# Patient Record
Sex: Male | Born: 1990 | Marital: Single | Smoking: Current every day smoker
Health system: Southern US, Community
[De-identification: ages and names within clinical notes are randomized; demographics above are authoritative.]

---

## 2015-12-07 ENCOUNTER — Ambulatory Visit (INDEPENDENT_AMBULATORY_CARE_PROVIDER_SITE_OTHER): Payer: Self-pay | Admitting: Family Medicine

## 2015-12-07 DIAGNOSIS — Z5329 Procedure and treatment not carried out because of patient's decision for other reasons: Secondary | ICD-10-CM

## 2015-12-07 NOTE — Progress Notes (Signed)
No show. Reschedule PRN

## 2015-12-14 ENCOUNTER — Telehealth: Payer: Self-pay

## 2015-12-14 ENCOUNTER — Encounter: Payer: Self-pay | Admitting: Family Medicine

## 2015-12-14 ENCOUNTER — Ambulatory Visit (INDEPENDENT_AMBULATORY_CARE_PROVIDER_SITE_OTHER): Payer: PRIVATE HEALTH INSURANCE | Admitting: Family Medicine

## 2015-12-14 VITALS — BP 107/74 | HR 80 | Ht 69.29 in | Wt 124.0 lb

## 2015-12-14 DIAGNOSIS — F321 Major depressive disorder, single episode, moderate: Secondary | ICD-10-CM | POA: Diagnosis not present

## 2015-12-14 DIAGNOSIS — Z0000 Encounter for general adult medical examination without abnormal findings: Secondary | ICD-10-CM

## 2015-12-14 DIAGNOSIS — IMO0001 Reserved for inherently not codable concepts without codable children: Secondary | ICD-10-CM

## 2015-12-14 DIAGNOSIS — F329 Major depressive disorder, single episode, unspecified: Secondary | ICD-10-CM | POA: Insufficient documentation

## 2015-12-14 MED ORDER — CITALOPRAM HYDROBROMIDE 20 MG PO TABS: 20.0000 mg | ORAL_TABLET | Freq: Every day | ORAL | Status: AC

## 2015-12-14 NOTE — Telephone Encounter (Signed)
Opened in error
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
## 2015-12-14 NOTE — Patient Instructions (Signed)
Thank you for coming in today. Start Celexa Return in 3 weeks.   Major Depressive Disorder Major depressive disorder is a mental illness. It also may be called clinical depression or unipolar depression. Major depressive disorder usually causes feelings of sadness, hopelessness, or helplessness. Some people with this disorder do not feel particularly sad but lose interest in doing things they used to enjoy (anhedonia). Major depressive disorder also can cause physical symptoms. It can interfere with work, school, relationships, and other normal everyday activities. The disorder varies in severity but is longer lasting and more serious than the sadness we all feel from time to time in our lives. Major depressive disorder often is triggered by stressful life events or major life changes. Examples of these triggers include divorce, loss of your job or home, a move, and the death of a family member or close friend. Sometimes this disorder occurs for no obvious reason at all. People who have family members with major depressive disorder or bipolar disorder are at higher risk for developing this disorder, with or without life stressors. Major depressive disorder can occur at any age. It may occur just once in your life (single episode major depressive disorder). It may occur multiple times (recurrent major depressive disorder). SYMPTOMS People with major depressive disorder have either anhedonia or depressed mood on nearly a daily basis for at least 2 weeks or longer. Symptoms of depressed mood include:  Feelings of sadness (blue or down in the dumps) or emptiness.  Feelings of hopelessness or helplessness.  Tearfulness or episodes of crying (may be observed by others).  Irritability (children and adolescents). In addition to depressed mood or anhedonia or both, people with this disorder have at least four of the following symptoms:  Difficulty sleeping or sleeping too much.   Significant change  (increase or decrease) in appetite or weight.   Lack of energy or motivation.  Feelings of guilt and worthlessness.   Difficulty concentrating, remembering, or making decisions.  Unusually slow movement (psychomotor retardation) or restlessness (as observed by others).   Recurrent wishes for death, recurrent thoughts of self-harm (suicide), or a suicide attempt. People with major depressive disorder commonly have persistent negative thoughts about themselves, other people, and the world. People with severe major depressive disorder may experiencedistorted beliefs or perceptions about the world (psychotic delusions). They also may see or hear things that are not real (psychotic hallucinations). DIAGNOSIS Major depressive disorder is diagnosed through an assessment by your health care provider. Your health care provider will ask aboutaspects of your daily life, such as mood,sleep, and appetite, to see if you have the diagnostic symptoms of major depressive disorder. Your health care provider may ask about your medical history and use of alcohol or drugs, including prescription medicines. Your health care provider also may do a physical exam and blood work. This is because certain medical conditions and the use of certain substances can cause major depressive disorder-like symptoms (secondary depression). Your health care provider also may refer you to a mental health specialist for further evaluation and treatment. TREATMENT It is important to recognize the symptoms of major depressive disorder and seek treatment. The following treatments can be prescribed for this disorder:   Medicine. Antidepressant medicines usually are prescribed. Antidepressant medicines are thought to correct chemical imbalances in the brain that are commonly associated with major depressive disorder. Other types of medicine may be added if the symptoms do not respond to antidepressant medicines alone or if psychotic  delusions or hallucinations occur.  Talk therapy. Talk therapy can be helpful in treating major depressive disorder by providing support, education, and guidance. Certain types of talk therapy also can help with negative thinking (cognitive behavioral therapy) and with relationship issues that trigger this disorder (interpersonal therapy). A mental health specialist can help determine which treatment is best for you. Most people with major depressive disorder do well with a combination of medicine and talk therapy. Treatments involving electrical stimulation of the brain can be used in situations with extremely severe symptoms or when medicine and talk therapy do not work over time. These treatments include electroconvulsive therapy, transcranial magnetic stimulation, and vagal nerve stimulation.   This information is not intended to replace advice given to you by your health care provider. Make sure you discuss any questions you have with your health care provider.   Document Released: 09/28/2012 Document Revised: 06/24/2014 Document Reviewed: 09/28/2012 Elsevier Interactive Patient Education [employer].

## 2016-01-04 ENCOUNTER — Ambulatory Visit (INDEPENDENT_AMBULATORY_CARE_PROVIDER_SITE_OTHER): Payer: PRIVATE HEALTH INSURANCE | Admitting: Family Medicine

## 2016-01-04 DIAGNOSIS — Z5329 Procedure and treatment not carried out because of patient's decision for other reasons: Secondary | ICD-10-CM

## 2016-01-04 NOTE — Progress Notes (Signed)
No show. Return soon.

## 2016-07-25 ENCOUNTER — Emergency Department (HOSPITAL_COMMUNITY)
Admission: EM | Admit: 2016-07-25 | Discharge: 2016-07-26 | Disposition: A | Discharge status: Home / Self Care | Payer: No Typology Code available for payment source | Attending: Emergency Medicine | Admitting: Emergency Medicine

## 2016-07-25 ENCOUNTER — Encounter (HOSPITAL_COMMUNITY): Payer: Self-pay | Admitting: Emergency Medicine

## 2016-07-25 DIAGNOSIS — F17200 Nicotine dependence, unspecified, uncomplicated: Secondary | ICD-10-CM | POA: Insufficient documentation

## 2016-07-25 DIAGNOSIS — Y9289 Other specified places as the place of occurrence of the external cause: Secondary | ICD-10-CM | POA: Diagnosis not present

## 2016-07-25 DIAGNOSIS — Y939 Activity, unspecified: Secondary | ICD-10-CM | POA: Insufficient documentation

## 2016-07-25 DIAGNOSIS — S7001XA Contusion of right hip, initial encounter: Secondary | ICD-10-CM | POA: Insufficient documentation

## 2016-07-25 DIAGNOSIS — Y999 Unspecified external cause status: Secondary | ICD-10-CM | POA: Insufficient documentation

## 2016-07-25 DIAGNOSIS — S0083XA Contusion of other part of head, initial encounter: Secondary | ICD-10-CM | POA: Diagnosis not present

## 2016-07-25 DIAGNOSIS — R930 Abnormal findings on diagnostic imaging of skull and head, not elsewhere classified: Secondary | ICD-10-CM | POA: Insufficient documentation

## 2016-07-25 DIAGNOSIS — Z79899 Other long term (current) drug therapy: Secondary | ICD-10-CM | POA: Insufficient documentation

## 2016-07-25 DIAGNOSIS — S0993XA Unspecified injury of face, initial encounter: Secondary | ICD-10-CM | POA: Diagnosis present
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
## 2016-07-26 ENCOUNTER — Emergency Department (HOSPITAL_COMMUNITY): Payer: No Typology Code available for payment source

## 2016-07-26 MED ORDER — HYDROCODONE-ACETAMINOPHEN 5-325 MG PO TABS: 1.0000 | ORAL_TABLET | Freq: Four times a day (QID) | ORAL | 0 refills | Status: AC | PRN

## 2016-07-26 MED ORDER — HYDROCODONE-ACETAMINOPHEN 5-325 MG PO TABS
2.0000 | ORAL_TABLET | Freq: Once | ORAL | Status: AC
  Administered 2016-07-26: 2 via ORAL
  Filled 2016-07-26: qty 2
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
## 2016-07-26 NOTE — Discharge Instructions (Signed)
Medications: Norco  Treatment: Take 1-2 Norco every 4-6 hours as needed for pain. Do not drive or operate machinery when taking Norco and only take as prescribed. You can alternate with ibuprofen as prescribed over-the-counter. Use ice to your jaw and hip 3-4 times daily alternating 20 minutes on, 20 minutes off. Ambulate with crutches carefully as needed. Begin bearing weight as tolerated.  Follow-up: Please follow-up with orthopedic doctor, Dr. Ranell PatrickNorris, as needed if your symptoms are not improving over the next few days or if you continue to be unable to bear weight on your leg.  An incidental finding was found on your head CT. You do not need follow up however, if you ever begin experiencing headaches, you may want to see a neurologist. 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

## 2016-09-27 ENCOUNTER — Ambulatory Visit (HOSPITAL_COMMUNITY)
Admission: EM | Admit: 2016-09-27 | Discharge: 2016-09-27 | Disposition: A | Discharge status: Home / Self Care | Payer: PRIVATE HEALTH INSURANCE | Attending: Family Medicine | Admitting: Family Medicine

## 2016-09-27 ENCOUNTER — Encounter (HOSPITAL_COMMUNITY): Payer: Self-pay | Admitting: Family Medicine

## 2016-09-27 DIAGNOSIS — W19XXXA Unspecified fall, initial encounter: Secondary | ICD-10-CM

## 2016-09-27 DIAGNOSIS — S0181XA Laceration without foreign body of other part of head, initial encounter: Secondary | ICD-10-CM

## 2016-09-27 NOTE — Discharge Instructions (Signed)
Follow up in 7 - 10 days for suture removal °
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
## 2016-09-27 NOTE — ED Provider Notes (Signed)
CSN: [id number]     Arrival date & time 09/27/16  1831 History   First MD Initiated Contact with Patient 09/27/16 1907     Chief Complaint  Patient presents with  . Laceration   (Consider location/radiation/quality/duration/timing/severity/associated sxs/prior Treatment) [place] and fell on a rock and lacerated his right forehead.  He denies LOC.   The history is provided by the patient.  Laceration  Location:  Head/neck Length:  5 cm Quality: jagged   Bleeding: venous   Time since incident:  3 hours Laceration mechanism:  Blunt object Pain details:    Quality:  Aching   Severity:  Mild   Timing:  Constant   Progression:  Unchanged Foreign body present:  No foreign bodies Relieved by:  Nothing Worsened by:  Nothing Tetanus status:  Up to date   History reviewed. No pertinent past medical history. History reviewed. No pertinent surgical history. History reviewed. No pertinent family history. Social History  Substance Use Topics  . Smoking status: Current Every Day Smoker  . Smokeless tobacco: Never Used  . Alcohol use No    Review of Systems  Constitutional: Negative.   HENT: Negative.   Eyes: Negative.   Respiratory: Negative.   Gastrointestinal: Negative.   Endocrine: Negative.   Genitourinary: Negative.   Musculoskeletal: Negative.   Skin: Positive for wound.  Allergic/Immunologic: Negative.   Neurological: Negative.   Hematological: Negative.   Psychiatric/Behavioral: Negative.     Allergies  Patient has no known allergies.  Home Medications   Prior to Admission medications   Medication Sig Start Date End Date Taking? Authorizing Provider  citalopram (CELEXA) 20 MG tablet Take 1 tablet (20 mg total) by mouth daily. 12/14/15   Rodolph Bong, MD  HYDROcodone-acetaminophen (NORCO/VICODIN) 5-325 MG tablet Take 1-2 tablets by mouth every 6 (six) hours as needed. 07/26/16   Emi Holes, PA-C   Meds Ordered and  Administered this Visit  Medications - No data to display  BP 120/71   Pulse 68   Temp 98.2 F (36.8 C)   Resp 18   SpO2 100%  No data found.   Physical Exam  Constitutional: He appears well-developed and well-nourished.  HENT:  Head: Normocephalic and atraumatic.  Eyes: Conjunctivae and EOM are normal. Pupils are equal, round, and reactive to light.  Neck: Normal range of motion. Neck supple.  Cardiovascular: Normal rate, regular rhythm and normal heart sounds.   Pulmonary/Chest: Effort normal and breath sounds normal.  Skin:  Laceration approx 5 cm right forehead.  Nursing note and vitals reviewed.   Urgent Care Course     .Marland KitchenLaceration Repair Date/Time: 09/27/2016 8:08 PM Performed by: Deatra Canter Authorized by: Hazeline Junker B   Consent:    Consent obtained:  Verbal   Consent given by:  Patient   Risks discussed:  Infection   Alternatives discussed:  No treatment Anesthesia (see MAR for exact dosages):    Anesthesia method:  Local infiltration   Local anesthetic:  Lidocaine 1% WITH epi Laceration details:    Location:  Face   Length (cm):  5   Depth (mm):  3 Repair type:    Repair type:  Simple Pre-procedure details:    Preparation:  Patient was prepped and draped in usual sterile fashion Exploration:    Hemostasis achieved with:  Direct pressure   Wound extent: areolar tissue violated     Contaminated: no   Treatment:    Area cleansed with:  Betadine and saline   Amount of cleaning:  Standard   Visualized foreign bodies/material removed: no   Skin repair:    Repair method:  Sutures   Suture size:  5-0   Suture material:  Nylon   Number of sutures:  7 Approximation:    Approximation:  Close   Vermilion border: well-aligned   Post-procedure details:    Dressing:  Antibiotic ointment   Patient tolerance of procedure:  Tolerated well, no immediate complications   (including critical care time)  Labs Review Labs Reviewed - No data to  display  Imaging Review No results found.   Visual Acuity Review  Right Eye Distance:   Left Eye Distance:   Bilateral Distance:    Right Eye Near:   Left Eye Near:    Bilateral Near:         MDM   1. Laceration of forehead, initial encounter         Deatra Canter, FNP 09/27/16 2011

## 2016-10-05 ENCOUNTER — Ambulatory Visit (HOSPITAL_COMMUNITY)
Admission: EM | Admit: 2016-10-05 | Discharge: 2016-10-05 | Disposition: A | Discharge status: Home / Self Care | Payer: Self-pay

## 2016-10-05 ENCOUNTER — Encounter (HOSPITAL_COMMUNITY): Payer: Self-pay | Admitting: Emergency Medicine

## 2016-10-05 DIAGNOSIS — Z4802 Encounter for removal of sutures: Secondary | ICD-10-CM

## 2016-10-05 NOTE — ED Triage Notes (Signed)
Pt here for suture removal of a laceration to the right forehead.  Wound is clean, dry and intact.

## 2016-10-05 NOTE — ED Notes (Signed)
Removed 7 sutures from pt's right forehead.  Pt denies any pain.  Pt instructed to keep wound clean until it completely heals.  Pt stated understanding.

## 2017-04-04 ENCOUNTER — Emergency Department (HOSPITAL_COMMUNITY)
Admission: EM | Admit: 2017-04-04 | Discharge: 2017-04-04 | Disposition: A | Discharge status: Home / Self Care | Payer: No Typology Code available for payment source | Attending: Emergency Medicine | Admitting: Emergency Medicine

## 2017-04-04 ENCOUNTER — Encounter (HOSPITAL_COMMUNITY): Payer: Self-pay

## 2017-04-04 ENCOUNTER — Emergency Department (HOSPITAL_COMMUNITY): Payer: No Typology Code available for payment source

## 2017-04-04 DIAGNOSIS — M79602 Pain in left arm: Secondary | ICD-10-CM | POA: Diagnosis present

## 2017-04-04 DIAGNOSIS — Z5321 Procedure and treatment not carried out due to patient leaving prior to being seen by health care provider: Secondary | ICD-10-CM | POA: Insufficient documentation

## 2017-04-04 NOTE — ED Notes (Signed)
At the desk requesting to leave.  Encouraged to stay without success.  Encouraged to return as needed.
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
